# Patient Record
Sex: Female | Born: 1975 | Race: White | Hispanic: No | Marital: Married | Smoking: Former smoker
Health system: Southern US, Community
[De-identification: ages and names within clinical notes are randomized; demographics above are authoritative.]

## PROBLEM LIST (undated history)

## (undated) DIAGNOSIS — K279 Peptic ulcer, site unspecified, unspecified as acute or chronic, without hemorrhage or perforation: Secondary | ICD-10-CM

## (undated) DIAGNOSIS — K8020 Calculus of gallbladder without cholecystitis without obstruction: Secondary | ICD-10-CM

## (undated) DIAGNOSIS — B9681 Helicobacter pylori [H. pylori] as the cause of diseases classified elsewhere: Secondary | ICD-10-CM

## (undated) HISTORY — PX: LEEP: SHX91

---

## 2016-11-22 ENCOUNTER — Observation Stay (HOSPITAL_COMMUNITY)
Admission: EM | Admit: 2016-11-22 | Discharge: 2016-11-23 | Disposition: A | Discharge status: Home / Self Care | Payer: BLUE CROSS/BLUE SHIELD | Attending: General Surgery | Admitting: General Surgery

## 2016-11-22 ENCOUNTER — Encounter (HOSPITAL_COMMUNITY): Payer: Self-pay

## 2016-11-22 DIAGNOSIS — K810 Acute cholecystitis: Secondary | ICD-10-CM | POA: Diagnosis not present

## 2016-11-22 DIAGNOSIS — Z9889 Other specified postprocedural states: Secondary | ICD-10-CM | POA: Diagnosis not present

## 2016-11-22 DIAGNOSIS — K219 Gastro-esophageal reflux disease without esophagitis: Secondary | ICD-10-CM | POA: Diagnosis not present

## 2016-11-22 DIAGNOSIS — Z87891 Personal history of nicotine dependence: Secondary | ICD-10-CM | POA: Diagnosis not present

## 2016-11-22 DIAGNOSIS — Z8619 Personal history of other infectious and parasitic diseases: Secondary | ICD-10-CM | POA: Insufficient documentation

## 2016-11-22 DIAGNOSIS — K8050 Calculus of bile duct without cholangitis or cholecystitis without obstruction: Secondary | ICD-10-CM

## 2016-11-22 DIAGNOSIS — Z79899 Other long term (current) drug therapy: Secondary | ICD-10-CM | POA: Diagnosis not present

## 2016-11-22 DIAGNOSIS — K811 Chronic cholecystitis: Principal | ICD-10-CM | POA: Insufficient documentation

## 2016-11-22 DIAGNOSIS — R109 Unspecified abdominal pain: Secondary | ICD-10-CM | POA: Diagnosis present

## 2016-11-22 DIAGNOSIS — I10 Essential (primary) hypertension: Secondary | ICD-10-CM | POA: Diagnosis not present

## 2016-11-22 DIAGNOSIS — Z880 Allergy status to penicillin: Secondary | ICD-10-CM | POA: Insufficient documentation

## 2016-11-22 HISTORY — DX: Calculus of gallbladder without cholecystitis without obstruction: K80.20

## 2016-11-22 HISTORY — DX: Peptic ulcer, site unspecified, unspecified as acute or chronic, without hemorrhage or perforation: K27.9

## 2016-11-22 HISTORY — DX: Helicobacter pylori (H. pylori) as the cause of diseases classified elsewhere: B96.81

## 2016-11-22 LAB — URINALYSIS, ROUTINE W REFLEX MICROSCOPIC
BILIRUBIN URINE: NEGATIVE
Glucose, UA: NEGATIVE mg/dL
KETONES UR: 20 mg/dL — AB
NITRITE: NEGATIVE
PH: 5 (ref 5.0–8.0)
Protein, ur: NEGATIVE mg/dL
SPECIFIC GRAVITY, URINE: 1.018 (ref 1.005–1.030)

## 2016-11-22 LAB — COMPREHENSIVE METABOLIC PANEL
ALT: 117 U/L — ABNORMAL HIGH (ref 14–54)
AST: 69 U/L — AB (ref 15–41)
Albumin: 4.1 g/dL (ref 3.5–5.0)
Alkaline Phosphatase: 94 U/L (ref 38–126)
Anion gap: 12 (ref 5–15)
BILIRUBIN TOTAL: 0.5 mg/dL (ref 0.3–1.2)
BUN: 8 mg/dL (ref 6–20)
CO2: 25 mmol/L (ref 22–32)
CREATININE: 0.64 mg/dL (ref 0.44–1.00)
Calcium: 9.3 mg/dL (ref 8.9–10.3)
Chloride: 100 mmol/L — ABNORMAL LOW (ref 101–111)
Glucose, Bld: 96 mg/dL (ref 65–99)
POTASSIUM: 4 mmol/L (ref 3.5–5.1)
Sodium: 137 mmol/L (ref 135–145)
TOTAL PROTEIN: 7.7 g/dL (ref 6.5–8.1)

## 2016-11-22 LAB — CBC
HEMATOCRIT: 41 % (ref 36.0–46.0)
Hemoglobin: 13.6 g/dL (ref 12.0–15.0)
MCH: 30.9 pg (ref 26.0–34.0)
MCHC: 33.2 g/dL (ref 30.0–36.0)
MCV: 93.2 fL (ref 78.0–100.0)
PLATELETS: 271 10*3/uL (ref 150–400)
RBC: 4.4 MIL/uL (ref 3.87–5.11)
RDW: 13.1 % (ref 11.5–15.5)
WBC: 9 10*3/uL (ref 4.0–10.5)

## 2016-11-22 LAB — PREGNANCY, URINE: Preg Test, Ur: NEGATIVE

## 2016-11-22 LAB — LIPASE, BLOOD: LIPASE: 37 U/L (ref 11–51)

## 2016-11-22 MED ORDER — SODIUM CHLORIDE 0.9 % IV BOLUS (SEPSIS)
500.0000 mL | Freq: Once | INTRAVENOUS | Status: AC
  Administered 2016-11-22: 500 mL via INTRAVENOUS

## 2016-11-22 MED ORDER — MORPHINE SULFATE (PF) 2 MG/ML IV SOLN: 2.0000 mg | INTRAVENOUS | Status: DC | PRN

## 2016-11-22 MED ORDER — ACETAMINOPHEN 325 MG PO TABS: 650.0000 mg | ORAL_TABLET | Freq: Four times a day (QID) | ORAL | Status: DC | PRN

## 2016-11-22 MED ORDER — MORPHINE SULFATE (PF) 4 MG/ML IV SOLN
4.0000 mg | Freq: Once | INTRAVENOUS | Status: DC
  Filled 2016-11-22: qty 1

## 2016-11-22 MED ORDER — ONDANSETRON HCL 4 MG/2ML IJ SOLN
4.0000 mg | Freq: Four times a day (QID) | INTRAMUSCULAR | Status: DC | PRN
  Filled 2016-11-22: qty 2

## 2016-11-22 MED ORDER — LACTATED RINGERS IV SOLN
INTRAVENOUS | Status: DC
  Administered 2016-11-22 – 2016-11-23 (×2): via INTRAVENOUS

## 2016-11-22 MED ORDER — ONDANSETRON HCL 4 MG PO TABS: 4.0000 mg | ORAL_TABLET | Freq: Four times a day (QID) | ORAL | Status: DC | PRN

## 2016-11-22 MED ORDER — ACETAMINOPHEN 650 MG RE SUPP: 650.0000 mg | Freq: Four times a day (QID) | RECTAL | Status: DC | PRN

## 2016-11-22 MED ORDER — NORETHIN ACE-ETH ESTRAD-FE 1-20 MG-MCG PO TABS: 1.0000 | ORAL_TABLET | Freq: Every day | ORAL | Status: DC

## 2016-11-22 NOTE — ED Triage Notes (Addendum)
Pt reports that she has a gall stone and h pylori. Pt had ultrasound on Monday. Medications started on 11/19/16. Pt is complaining of upper abdominal that radiates into back. [place] and information was supposed to be sent to Dr Lovell SheehanJenkins

## 2016-11-22 NOTE — ED Provider Notes (Signed)
[facility]-EM[facility]Y DEPT Provider Note   CSN: [id number] Arrival date & time: 11/22/16  1624     History   Chief Complaint Chief Complaint  Patient presents with  . Abdominal Pain    HPI Lynn Rivera is a 41 y.o. female.  HPI Patient presents to the emergency room for evaluation of persistent abdominal pain. Patient has been having upper abdominal pain that radiates to her back. Patient was seen by her doctor and had an outpatient ultrasound performed. This test was done on Monday. Patient was called back and told that she had gallstones. She also tested for H. pylori. She is currently taking medications for that.  Patient is scheduled to see Dr. Lovell SheehanJenkins later this month. She continues to have unfortunately upper abdominal pain. She has not taken anything for pain.  She denies any nausea vomiting. No fevers.  Past Medical History:  Diagnosis Date  . Gall stones   . H pylori ulcer     There are no active problems to display for this patient.   Past Surgical History:  Procedure Laterality Date  . LEEP      OB History    No data available       Home Medications    Prior to Admission medications   Medication Sig Start Date End Date Taking? Authorizing Provider  Cholecalciferol 1000 units TBDP Take 1 tablet by mouth daily.   Yes [provider]  clarithromycin (BIAXIN) 500 MG tablet Take 1 tablet by mouth every 12 (twelve) hours. For 10 days. 11/19/16  Yes [provider]  DEXILANT 30 MG capsule Take 1 capsule by mouth daily. 10/02/16  Yes [provider]  JUNEL FE 1/20 1-20 MG-MCG tablet Take 1 tablet by mouth daily. 11/13/16  Yes [provider]  lisinopril (PRINIVIL,ZESTRIL) 10 MG tablet Take 1 tablet by mouth daily. 10/01/16  Yes [provider]  metroNIDAZOLE (FLAGYL) 500 MG tablet Take 1 tablet by mouth 2 (two) times daily. 7 days. 11/19/16  Yes [provider]  PRESCRIPTION MEDICATION Take 15 mLs by mouth every 2 (two)  hours. GI Cocktail 11/15/16  Yes [provider]    Family History No family history on file.  Social History Social History  Substance Use Topics  . Smoking status: Former Smoker    Quit date: 03/25/2016  . Smokeless tobacco: Never Used  . Alcohol use Yes     Comment: socially every other week     Allergies   Penicillins   Review of Systems Review of Systems  All other systems reviewed and are negative.    Physical Exam Updated Vital Signs BP 122/83   Pulse 89   Temp (!) 97.5 F (36.4 C) (Oral)   Resp 18   Ht 1.6 m (5\' 3" )   Wt 83.9 kg (185 lb)   LMP 11/22/2016   SpO2 99%   BMI 32.77 kg/m   Physical Exam  Constitutional: She appears well-developed and well-nourished. No distress.  HENT:  Head: Normocephalic and atraumatic.  Right Ear: External ear normal.  Left Ear: External ear normal.  Eyes: Conjunctivae are normal. Right eye exhibits no discharge. Left eye exhibits no discharge. No scleral icterus.  Neck: Neck supple. No tracheal deviation present.  Cardiovascular: Normal rate, regular rhythm and intact distal pulses.   Pulmonary/Chest: Effort normal and breath sounds normal. No stridor. No respiratory distress. She has no wheezes. She has no rales.  Abdominal: Soft. Bowel sounds are normal. She exhibits no distension. There is tenderness  in the right upper quadrant and epigastric area. There is no rebound and no guarding.  Musculoskeletal: She exhibits no edema or tenderness.  Neurological: She is alert. She has normal strength. No cranial nerve deficit (no facial droop, extraocular movements intact, no slurred speech) or sensory deficit. She exhibits normal muscle tone. She displays no seizure activity. Coordination normal.  Skin: Skin is warm and dry. No rash noted.  Psychiatric: She has a normal mood and affect.  Nursing note and vitals reviewed.    ED Treatments / Results  Labs (all labs ordered are listed, but only abnormal results are  displayed) Labs Reviewed  COMPREHENSIVE METABOLIC PANEL - Abnormal; Notable for the following:       Result Value   Chloride 100 (*)    AST 69 (*)    ALT 117 (*)    All other components within normal limits  LIPASE, BLOOD  CBC  URINALYSIS, ROUTINE W REFLEX MICROSCOPIC  PREGNANCY, URINE     Radiology No results found.  Procedures Procedures (including critical care time)  Medications Ordered in ED Medications  morphine 4 MG/ML injection 4 mg (4 mg Intravenous Not Given 11/22/16 1958)  sodium chloride 0.9 % bolus 500 mL (500 mLs Intravenous New Bag/Given 11/22/16 1932)     Initial Impression / Assessment and Plan / ED Course  I have reviewed the triage vital signs and the nursing notes.  Pertinent labs & imaging results that were available during my care of the patient were reviewed by me and considered in my medical decision making (see chart for details).  Clinical Course as of Nov 23 2107  Thu Nov 22, 2016  2107 Discussed with Dr Lovell Sheehan.  He reviewed her labs.    Would like to have the patient admitted to the hospital.  He would like to take her to surgery in the am.  NPO past midnight.  [JK]    Clinical Course User Index [JK] Linwood Dibbles, MD    Patient presented to the emergency room for evaluation of persistent biliary colic. Patient had an outpatient ultrasound that showed gallstones. She is scheduled to see Dr. Lovell Sheehan on the 17th. I spoke with Dr Lovell Sheehan.  He is going out of town next week.  Would like to have the patient admitted to the medical service. Nothing by mouth past midnight. He plans on having her gallbladder removed well. He would like to get the ultrasound results that she had as an outpatient.  Final Clinical Impressions(s) / ED Diagnoses   Final diagnoses:  Biliary colic      Linwood Dibbles, MD 11/22/16 2120

## 2016-11-22 NOTE — ED Notes (Signed)
Pt informed of need for urine sample. Pt states she does not have to go at this time but requests "a few more minutes" before attempting to provide sample.

## 2016-11-22 NOTE — H&P (Signed)
History and Physical    Roger Fasnacht WUJ:[id number] DOB: 05-12-1976 DOA: 11/22/2016  PCP: Devra Dopp, PA-C Consultants:  OB[facility] - [facility]; has an upcoming appointment with Dr. Lovell Sheehan Patient coming from: Home - lives with husband and son; NOK: husband, [phone]  Chief Complaint: Abdominal pain  HPI: Lynn Rivera is a 41 y.o. female with no significant past medical history presenting with cholecystitis.  Her symptoms started on Father's Day, when she did not feel well.  She was light-headed and flushed.  She has chronic GERD and she was thinking the reflux was acting up.  Last Thursday, she awoke with severe midepigastric pain and diffuse sweating.  Pain radiatied into her back and her stomach was burning.  She never vomited but she had mild intermittent nausea - particularly when she hadn't eaten anything in a while.  Belching, bloating, flatulence.  That morning, she called for a PCP appointment and she was seen at their express care.  She had UA, blood work, and a RUQ [place] Thursday AM at [facility].  Monday AM, the nurse called to let her know there is a gallstone with elevated LFTs,  H. pylori was positive and she was called in 2 antibiotics, started taking them Tuesday AM.  Pain worsened and she called the nurse back Tuesday.  She called Dr. Lovell Sheehan office and scheduled an appointment herself.  She continued to feel bad through yesterday AM.  It got a bit better after lunch.  Today, she went to work and ate breakfast and it started hurting again.  Constant band-like pressure around her upper abdomen.  Mid-epigastric burning.  No fever.     ED Course: Biliary colic - Dr. Lovell Sheehan requests admission and NPO for cholecystectomy  Review of Systems: As per HPI; otherwise review of systems reviewed and negative.   Ambulatory Status:  Ambulates without assistance  Past Medical History:  Diagnosis Date  . Gall stones   . H pylori ulcer     Past Surgical History:  Procedure  Laterality Date  . LEEP      Social History   Social History  . Marital status: Married    Spouse name: N/A  . Number of children: N/A  . Years of education: N/A   Occupational History  . Insurance agent    Social History Main Topics  . Smoking status: Former Smoker    Packs/day: 0.50    Years: 15.00    Quit date: 03/25/2016  . Smokeless tobacco: Never Used  . Alcohol use Yes     Comment: socially every other week 2-3 days  . Drug use: No  . Sexual activity: Not on file   Other Topics Concern  . Not on file   Social History Narrative  . No narrative on file    Allergies  Allergen Reactions  . Penicillins     Hives     History reviewed. No pertinent family history.  Prior to Admission medications   Medication Sig Start Date End Date Taking? Authorizing Provider  Cholecalciferol 1000 units TBDP Take 1 tablet by mouth daily.   Yes [provider]  clarithromycin (BIAXIN) 500 MG tablet Take 1 tablet by mouth every 12 (twelve) hours. For 10 days. 11/19/16  Yes [provider]  DEXILANT 30 MG capsule Take 1 capsule by mouth daily. 10/02/16  Yes [provider]  JUNEL FE 1/20 1-20 MG-MCG tablet Take 1 tablet by mouth daily. 11/13/16  Yes [provider]  lisinopril (PRINIVIL,ZESTRIL) 10 MG tablet  Take 1 tablet by mouth daily. 10/01/16  Yes [provider]  metroNIDAZOLE (FLAGYL) 500 MG tablet Take 1 tablet by mouth 2 (two) times daily. 7 days. 11/19/16  Yes [provider]  PRESCRIPTION MEDICATION Take 15 mLs by mouth every 2 (two) hours. GI Cocktail 11/15/16  Yes [provider]    Physical Exam: Vitals:   11/22/16 2000 11/22/16 2015 11/22/16 2030 11/22/16 2114  BP: 128/77  122/83 118/80  Pulse: 82 84 89 84  Resp:    17  Temp:      TempSrc:      SpO2: 100% 100% 99% 98%  Weight:      Height:         General:  Appears calm and comfortable and is NAD; she was holding her midepigastric region throughout our  encounter Eyes:  PERRL, EOMI, normal lids, iris ENT:  grossly normal hearing, lips & tongue, mmm Neck:  no LAD, masses or thyromegaly Cardiovascular:  RRR, no m/r/g. No LE edema.  Respiratory:  CTA bilaterally, no w/r/r. Normal respiratory effort. Abdomen:  soft, +midepigastric TTP > RUQ TTP, nd, hypoactive BS Skin:  no rash or induration seen on limited exam Musculoskeletal:  grossly normal tone BUE/BLE, good ROM, no bony abnormality Psychiatric:  grossly normal mood and affect, speech fluent and appropriate, AOx3 Neurologic:  CN 2-12 grossly intact, moves all extremities in coordinated fashion, sensation intact  Labs on Admission: I have personally reviewed following labs and imaging studies  CBC:  Recent Labs Lab 11/22/16 1826  WBC 9.0  HGB 13.6  HCT 41.0  MCV 93.2  PLT 271   Basic Metabolic Panel:  Recent Labs Lab 11/22/16 1826  NA 137  K 4.0  CL 100*  CO2 25  GLUCOSE 96  BUN 8  CREATININE 0.64  CALCIUM 9.3   GFR: Estimated Creatinine Clearance: 94 mL/min (by C-G formula based on SCr of 0.64 mg/dL). Liver Function Tests:  Recent Labs Lab 11/22/16 1826  AST 69*  ALT 117*  ALKPHOS 94  BILITOT 0.5  PROT 7.7  ALBUMIN 4.1    Recent Labs Lab 11/22/16 1826  LIPASE 37   No results for input(s): AMMONIA in the last 168 hours. Coagulation Profile: No results for input(s): INR, PROTIME in the last 168 hours. Cardiac Enzymes: No results for input(s): CKTOTAL, CKMB, CKMBINDEX, TROPONINI in the last 168 hours. BNP (last 3 results) No results for input(s): PROBNP in the last 8760 hours. HbA1C: No results for input(s): HGBA1C in the last 72 hours. CBG: No results for input(s): GLUCAP in the last 168 hours. Lipid Profile: No results for input(s): CHOL, HDL, LDLCALC, TRIG, CHOLHDL, LDLDIRECT in the last 72 hours. Thyroid Function Tests: No results for input(s): TSH, T4TOTAL, FREET4, T3FREE, THYROIDAB in the last 72 hours. Anemia Panel: No results for  input(s): VITAMINB12, FOLATE, FERRITIN, TIBC, IRON, RETICCTPCT in the last 72 hours. Urine analysis:    Component Value Date/Time   COLORURINE YELLOW 11/22/2016 2110   APPEARANCEUR HAZY (A) 11/22/2016 2110   LABSPEC 1.018 11/22/2016 2110   PHURINE 5.0 11/22/2016 2110   GLUCOSEU NEGATIVE 11/22/2016 2110   HGBUR MODERATE (A) 11/22/2016 2110   BILIRUBINUR NEGATIVE 11/22/2016 2110   KETONESUR 20 (A) 11/22/2016 2110   PROTEINUR NEGATIVE 11/22/2016 2110   NITRITE NEGATIVE 11/22/2016 2110   LEUKOCYTESUR TRACE (A) 11/22/2016 2110    Creatinine Clearance: Estimated Creatinine Clearance: 94 mL/min (by C-G formula based on SCr of 0.64 mg/dL).  Sepsis Labs: @LABRCNTIP (procalcitonin:4,lacticidven:4) )No results found for  this or any previous visit (from the past 240 hour(s)).   Radiological Exams on Admission: No results found.  EKG:  Not done  Assessment/Plan Principal Problem:   Acute cholecystitis   Pertinent labs: AST 69/ALT 117 UA: rare bacteria, moderate Hgb, 20 ketones, trace LE  -Patient with recent diagnosis of cholecystitis as well as +H pylori testing presenting with worsening RUQ/midepigastric pain -[facility] from [facility] is unavailable and this information will be needed in the AM; will reorder the RUQ [place] unless results can be obtained sooner -Clear liquids, NPO after midnight -Morphine for pain, Zofran for nausea -Dr. Lovell Sheehan to see in AM for probable cholecystectomy -Empiric coverage with Zosyn for now -May need intraoperative cholangiogram to ensure no additional stones are contributing to elevated LFTs    DVT prophylaxis:  SCDs Code Status: Full - confirmed with patient/family.  However, she is adamant that she would only want very short-term life support and only in this particular circumstance (clear problem with an easy solution and very low likelihood of a bad outcome) Family Communication: Husband present throughout evaluation  Disposition  Plan:  Home once clinically improved Consults called: Surgery  Admission status: Admit - It is my clinical opinion that admission to INPATIENT is reasonable and necessary because this patient will require at least 2 midnights in the hospital to treat this condition based on the medical complexity of the problems presented.  Given the aforementioned information, the predictability of an adverse outcome is felt to be significant.    Jonah Blue MD [facility]  If 7PM-7AM, please contact night-coverage [email or web address] Password [email or web address]  11/22/2016, 11:01 PM

## 2016-11-23 ENCOUNTER — Inpatient Hospital Stay (HOSPITAL_COMMUNITY): Payer: BLUE CROSS/BLUE SHIELD

## 2016-11-23 ENCOUNTER — Encounter (HOSPITAL_COMMUNITY): Payer: Self-pay | Admitting: *Deleted

## 2016-11-23 ENCOUNTER — Encounter (HOSPITAL_COMMUNITY)
Admission: EM | Disposition: A | Discharge status: Home / Self Care | Payer: Self-pay | Source: Home / Self Care | Attending: Emergency Medicine

## 2016-11-23 ENCOUNTER — Inpatient Hospital Stay (HOSPITAL_COMMUNITY): Payer: BLUE CROSS/BLUE SHIELD | Admitting: Anesthesiology

## 2016-11-23 DIAGNOSIS — K810 Acute cholecystitis: Secondary | ICD-10-CM

## 2016-11-23 HISTORY — PX: CHOLECYSTECTOMY: SHX55

## 2016-11-23 LAB — CBC
HEMATOCRIT: 36.7 % (ref 36.0–46.0)
HEMOGLOBIN: 12.1 g/dL (ref 12.0–15.0)
MCH: 30.9 pg (ref 26.0–34.0)
MCHC: 33 g/dL (ref 30.0–36.0)
MCV: 93.6 fL (ref 78.0–100.0)
Platelets: 257 10*3/uL (ref 150–400)
RBC: 3.92 MIL/uL (ref 3.87–5.11)
RDW: 13.4 % (ref 11.5–15.5)
WBC: 6.5 10*3/uL (ref 4.0–10.5)

## 2016-11-23 LAB — COMPREHENSIVE METABOLIC PANEL
ALBUMIN: 3.2 g/dL — AB (ref 3.5–5.0)
ALT: 91 U/L — ABNORMAL HIGH (ref 14–54)
AST: 52 U/L — AB (ref 15–41)
Alkaline Phosphatase: 77 U/L (ref 38–126)
Anion gap: 7 (ref 5–15)
BILIRUBIN TOTAL: 0.5 mg/dL (ref 0.3–1.2)
BUN: 7 mg/dL (ref 6–20)
CO2: 27 mmol/L (ref 22–32)
Calcium: 8.6 mg/dL — ABNORMAL LOW (ref 8.9–10.3)
Chloride: 104 mmol/L (ref 101–111)
Creatinine, Ser: 0.69 mg/dL (ref 0.44–1.00)
GFR calc Af Amer: >60 mL/min (ref >60)
GFR calc non Af Amer: >60 mL/min (ref >60)
GLUCOSE: 92 mg/dL (ref 65–99)
POTASSIUM: 4 mmol/L (ref 3.5–5.1)
SODIUM: 138 mmol/L (ref 135–145)
TOTAL PROTEIN: 6.3 g/dL — AB (ref 6.5–8.1)

## 2016-11-23 LAB — SURGICAL PCR SCREEN
MRSA, PCR: NEGATIVE
STAPHYLOCOCCUS AUREUS: NEGATIVE

## 2016-11-23 SURGERY — LAPAROSCOPIC CHOLECYSTECTOMY
Anesthesia: General | Site: Abdomen

## 2016-11-23 MED ORDER — MIDAZOLAM HCL 2 MG/2ML IJ SOLN
INTRAMUSCULAR | Status: AC
  Filled 2016-11-23: qty 2

## 2016-11-23 MED ORDER — PHENYLEPHRINE 40 MCG/ML (10ML) SYRINGE FOR IV PUSH (FOR BLOOD PRESSURE SUPPORT)
PREFILLED_SYRINGE | INTRAVENOUS | Status: AC
  Filled 2016-11-23: qty 10

## 2016-11-23 MED ORDER — SODIUM CHLORIDE 0.9 % IV SOLN
1.0000 g | Freq: Once | INTRAVENOUS | Status: AC
  Administered 2016-11-23: 1 g via INTRAVENOUS
  Filled 2016-11-23 (×2): qty 1

## 2016-11-23 MED ORDER — CHLORHEXIDINE GLUCONATE CLOTH 2 % EX PADS
6.0000 | MEDICATED_PAD | Freq: Once | CUTANEOUS | Status: AC
  Administered 2016-11-23: 6 via TOPICAL

## 2016-11-23 MED ORDER — ROCURONIUM BROMIDE 100 MG/10ML IV SOLN
INTRAVENOUS | Status: DC | PRN
  Administered 2016-11-23: 20 mg via INTRAVENOUS
  Administered 2016-11-23: 5 mg via INTRAVENOUS

## 2016-11-23 MED ORDER — ROCURONIUM BROMIDE 50 MG/5ML IV SOLN
INTRAVENOUS | Status: AC
  Filled 2016-11-23: qty 1

## 2016-11-23 MED ORDER — SODIUM CHLORIDE 0.9 % IR SOLN
Status: DC | PRN
Start: 2016-11-23 — End: 2016-11-23
  Administered 2016-11-23: 1000 mL

## 2016-11-23 MED ORDER — GLYCOPYRROLATE 0.2 MG/ML IJ SOLN
INTRAMUSCULAR | Status: DC | PRN
  Administered 2016-11-23: 0.1 mg via INTRAVENOUS

## 2016-11-23 MED ORDER — FENTANYL CITRATE (PF) 100 MCG/2ML IJ SOLN
INTRAMUSCULAR | Status: DC | PRN
  Administered 2016-11-23 (×4): 50 ug via INTRAVENOUS
  Administered 2016-11-23: 100 ug via INTRAVENOUS
  Administered 2016-11-23: 50 ug via INTRAVENOUS

## 2016-11-23 MED ORDER — SUGAMMADEX SODIUM 200 MG/2ML IV SOLN
INTRAVENOUS | Status: DC | PRN
  Administered 2016-11-23: 170 mg via INTRAVENOUS

## 2016-11-23 MED ORDER — SUCCINYLCHOLINE CHLORIDE 20 MG/ML IJ SOLN
INTRAMUSCULAR | Status: AC
  Filled 2016-11-23: qty 1

## 2016-11-23 MED ORDER — BUPIVACAINE HCL (PF) 0.5 % IJ SOLN
INTRAMUSCULAR | Status: AC
  Filled 2016-11-23: qty 30

## 2016-11-23 MED ORDER — POVIDONE-IODINE 10 % EX OINT
TOPICAL_OINTMENT | CUTANEOUS | Status: AC
  Filled 2016-11-23: qty 1

## 2016-11-23 MED ORDER — FENTANYL CITRATE (PF) 100 MCG/2ML IJ SOLN
25.0000 ug | INTRAMUSCULAR | Status: DC | PRN
  Administered 2016-11-23: 50 ug via INTRAVENOUS
  Filled 2016-11-23: qty 2

## 2016-11-23 MED ORDER — DEXAMETHASONE SODIUM PHOSPHATE 4 MG/ML IJ SOLN
INTRAMUSCULAR | Status: DC | PRN
  Administered 2016-11-23: 4 mg via INTRAVENOUS

## 2016-11-23 MED ORDER — BUPIVACAINE HCL (PF) 0.5 % IJ SOLN
INTRAMUSCULAR | Status: DC | PRN
  Administered 2016-11-23: 10 mL

## 2016-11-23 MED ORDER — DEXAMETHASONE SODIUM PHOSPHATE 4 MG/ML IJ SOLN
INTRAMUSCULAR | Status: AC
  Filled 2016-11-23: qty 1

## 2016-11-23 MED ORDER — ONDANSETRON HCL 4 MG/2ML IJ SOLN
4.0000 mg | Freq: Once | INTRAMUSCULAR | Status: AC
  Administered 2016-11-23: 4 mg via INTRAVENOUS

## 2016-11-23 MED ORDER — HYDROCODONE-ACETAMINOPHEN 5-325 MG PO TABS
1.0000 | ORAL_TABLET | ORAL | Status: DC | PRN
  Administered 2016-11-23: 2 via ORAL
  Filled 2016-11-23: qty 2

## 2016-11-23 MED ORDER — GLYCOPYRROLATE 0.2 MG/ML IJ SOLN
INTRAMUSCULAR | Status: AC
  Filled 2016-11-23: qty 1

## 2016-11-23 MED ORDER — POVIDONE-IODINE 10 % OINT PACKET
TOPICAL_OINTMENT | CUTANEOUS | Status: DC | PRN
  Administered 2016-11-23: 1 via TOPICAL

## 2016-11-23 MED ORDER — PROPOFOL 10 MG/ML IV BOLUS
INTRAVENOUS | Status: DC | PRN
  Administered 2016-11-23: 170 mg via INTRAVENOUS

## 2016-11-23 MED ORDER — LACTATED RINGERS IV SOLN
INTRAVENOUS | Status: DC
  Administered 2016-11-23: 09:00:00 via INTRAVENOUS

## 2016-11-23 MED ORDER — METOCLOPRAMIDE HCL 5 MG/ML IJ SOLN
INTRAMUSCULAR | Status: DC | PRN
  Administered 2016-11-23: 10 mg via INTRAVENOUS

## 2016-11-23 MED ORDER — PHENYLEPHRINE HCL 10 MG/ML IJ SOLN
INTRAMUSCULAR | Status: DC | PRN
  Administered 2016-11-23: 80 ug via INTRAVENOUS

## 2016-11-23 MED ORDER — PROPOFOL 10 MG/ML IV BOLUS
INTRAVENOUS | Status: AC
  Filled 2016-11-23: qty 20

## 2016-11-23 MED ORDER — HEMOSTATIC AGENTS (NO CHARGE) OPTIME
TOPICAL | Status: DC | PRN
  Administered 2016-11-23: 1 via TOPICAL

## 2016-11-23 MED ORDER — METOCLOPRAMIDE HCL 5 MG/ML IJ SOLN
INTRAMUSCULAR | Status: AC
  Filled 2016-11-23: qty 2

## 2016-11-23 MED ORDER — LIDOCAINE HCL (PF) 1 % IJ SOLN
INTRAMUSCULAR | Status: DC | PRN
  Administered 2016-11-23: 40 mg

## 2016-11-23 MED ORDER — SUGAMMADEX SODIUM 500 MG/5ML IV SOLN
INTRAVENOUS | Status: AC
  Filled 2016-11-23: qty 5

## 2016-11-23 MED ORDER — CHLORHEXIDINE GLUCONATE CLOTH 2 % EX PADS: 6.0000 | MEDICATED_PAD | Freq: Once | CUTANEOUS | Status: DC

## 2016-11-23 MED ORDER — SUCCINYLCHOLINE CHLORIDE 20 MG/ML IJ SOLN
INTRAMUSCULAR | Status: DC | PRN
  Administered 2016-11-23: 100 mg via INTRAVENOUS

## 2016-11-23 MED ORDER — FENTANYL CITRATE (PF) 250 MCG/5ML IJ SOLN
INTRAMUSCULAR | Status: AC
  Filled 2016-11-23: qty 5

## 2016-11-23 MED ORDER — MIDAZOLAM HCL 2 MG/2ML IJ SOLN
1.0000 mg | INTRAMUSCULAR | Status: DC
  Administered 2016-11-23: 2 mg via INTRAVENOUS

## 2016-11-23 MED ORDER — LIDOCAINE HCL (PF) 1 % IJ SOLN
INTRAMUSCULAR | Status: AC
  Filled 2016-11-23: qty 5

## 2016-11-23 MED ORDER — KETOROLAC TROMETHAMINE 30 MG/ML IJ SOLN
30.0000 mg | Freq: Once | INTRAMUSCULAR | Status: AC
  Administered 2016-11-23: 30 mg via INTRAVENOUS
  Filled 2016-11-23: qty 1

## 2016-11-23 MED ORDER — FENTANYL CITRATE (PF) 100 MCG/2ML IJ SOLN
INTRAMUSCULAR | Status: AC
  Filled 2016-11-23: qty 2

## 2016-11-23 MED ORDER — HYDROCODONE-ACETAMINOPHEN 5-325 MG PO TABS: 1.0000 | ORAL_TABLET | ORAL | 0 refills | Status: AC | PRN

## 2016-11-23 SURGICAL SUPPLY — 44 items
APPLIER CLIP LAPSCP 10X32 DD (CLIP) ×3 IMPLANT
BAG HAMPER (MISCELLANEOUS) ×3 IMPLANT
BAG RETRIEVAL 10 (BASKET) ×1
BAG RETRIEVAL 10MM (BASKET) ×1
CHLORAPREP W/TINT 26ML (MISCELLANEOUS) ×3 IMPLANT
CLOTH BEACON ORANGE TIMEOUT ST (SAFETY) ×3 IMPLANT
COVER LIGHT HANDLE STERIS (MISCELLANEOUS) ×6 IMPLANT
DECANTER SPIKE VIAL GLASS SM (MISCELLANEOUS) ×3 IMPLANT
ELECT REM PT RETURN 9FT ADLT (ELECTROSURGICAL) ×3
ELECTRODE REM PT RTRN 9FT ADLT (ELECTROSURGICAL) ×1 IMPLANT
FILTER SMOKE EVAC LAPAROSHD (FILTER) ×3 IMPLANT
FORMALIN 10 PREFIL 120ML (MISCELLANEOUS) ×3 IMPLANT
GLOVE BIOGEL PI IND STRL 7.0 (GLOVE) ×2 IMPLANT
GLOVE BIOGEL PI INDICATOR 7.0 (GLOVE) ×4
GLOVE SURG SS PI 7.5 STRL IVOR (GLOVE) ×3 IMPLANT
GOWN STRL REUS W/ TWL XL LVL3 (GOWN DISPOSABLE) ×1 IMPLANT
GOWN STRL REUS W/TWL LRG LVL3 (GOWN DISPOSABLE) ×6 IMPLANT
GOWN STRL REUS W/TWL XL LVL3 (GOWN DISPOSABLE) ×2
HEMOSTAT SNOW SURGICEL 2X4 (HEMOSTASIS) ×3 IMPLANT
INST SET LAPROSCOPIC AP (KITS) ×3 IMPLANT
IV NS IRRIG 3000ML ARTHROMATIC (IV SOLUTION) IMPLANT
KIT ROOM TURNOVER APOR (KITS) ×3 IMPLANT
MANIFOLD NEPTUNE II (INSTRUMENTS) ×3 IMPLANT
NEEDLE INSUFFLATION 14GA 120MM (NEEDLE) ×3 IMPLANT
NS IRRIG 1000ML POUR BTL (IV SOLUTION) ×3 IMPLANT
PACK LAP CHOLE LZT030E (CUSTOM PROCEDURE TRAY) ×3 IMPLANT
PAD ARMBOARD 7.5X6 YLW CONV (MISCELLANEOUS) ×3 IMPLANT
SET BASIN LINEN APH (SET/KITS/TRAYS/PACK) ×3 IMPLANT
SET TUBE IRRIG SUCTION NO TIP (IRRIGATION / IRRIGATOR) IMPLANT
SLEEVE ENDOPATH XCEL 5M (ENDOMECHANICALS) ×3 IMPLANT
SPONGE GAUZE 2X2 8PLY STER LF (GAUZE/BANDAGES/DRESSINGS) ×1
SPONGE GAUZE 2X2 8PLY STRL LF (GAUZE/BANDAGES/DRESSINGS) ×2 IMPLANT
STAPLER VISISTAT (STAPLE) ×3 IMPLANT
SUT VICRYL 0 UR6 27IN ABS (SUTURE) ×3 IMPLANT
SYS BAG RETRIEVAL 10MM (BASKET) ×1
SYSTEM BAG RETRIEVAL 10MM (BASKET) ×1 IMPLANT
TAPE PAPER 2X10 WHT MICROPORE (GAUZE/BANDAGES/DRESSINGS) ×3 IMPLANT
TROCAR ENDO BLADELESS 11MM (ENDOMECHANICALS) ×3 IMPLANT
TROCAR XCEL NON-BLD 5MMX100MML (ENDOMECHANICALS) ×3 IMPLANT
TROCAR XCEL UNIV SLVE 11M 100M (ENDOMECHANICALS) ×3 IMPLANT
TUBE CONNECTING 12'X1/4 (SUCTIONS) ×1
TUBE CONNECTING 12X1/4 (SUCTIONS) ×2 IMPLANT
TUBING INSUFFLATION (TUBING) ×3 IMPLANT
WARMER LAPAROSCOPE (MISCELLANEOUS) ×3 IMPLANT

## 2016-11-23 NOTE — Op Note (Signed)
Patient:  Lynn Rivera  DOB:  11/15/1975  MRN:  [id number]   Preop Diagnosis:  Cholecystitis, cholelithiasis  Postop Diagnosis:  Same  Procedure:  Laparoscopic cholecystectomy  Surgeon:  Franky MachoMark Suzie Vandam, M.D.  Anes:  Gen. endotracheal  Indications:  Patient is a 41 year old white female who presents with right upper quadrant abdominal pain and abnormal liver enzyme tests. Ultrasound gallbladder reveals sludge with probable cholelithiasis. The risks and benefits of the procedure including bleeding, infection, hepatobiliary injury, and the possibility of an open procedure were fully explained to the patient, who gave informed consent.  Procedure note:  The patient was placed the supine position. After induction of general endotracheal anesthesia, the abdomen was prepped and draped using the usual sterile technique with DuraPrep. Surgical site confirmation was performed.  A supraumbilical incision was made down to the fascia. A Veress needle was introduced into the abdominal cavity and confirmation of placement was done using the saline drop test. The abdomen was then insufflated to 16 mmHg pressure. An 11 mm trocar was introduced into the abdominal cavity under direct visualization without difficulty. The patient was placed in reverse Trendelenburg position and an additional 11 mm trocar was placed the epigastric region and 5 mm trochars were placed the right upper quadrant and right flank regions. Liver was inspected and noted to be within normal limits. The gallbladder was retracted in a dynamic fashion in order to provide a critical view of the triangle of Calot. The cystic duct was first identified. Its juncture to the infundibulum was fully identified. Endoclips were placed proximally and distally on the cystic duct, and the cystic duct was divided. This was likewise done to the cystic artery. The gallbladder was freed away from the gallbladder fossa using Bovie electrocautery. The gallbladder was  delivered through the epigastric trocar site using an Endo Catch bag. The gallbladder fossa was inspected and no abnormal bleeding or bile leakage was noted. Surgicel was placed the gallbladder fossa. All fluid and air were then evacuated from the abdominal cavity prior to the removal of the trochars.  All wounds were irrigated with normal saline. All wounds were injected with 0.5% Sensorcaine. The supraumbilical fascia was reapproximated using an 0 Vicryl interrupted suture. All skin incisions were closed using staples. Betadine ointment and dry sterile dressings were applied.  All tape and needle counts were correct at the end the procedure. The patient was extubated in the operating room and transferred to PACU in stable condition.  Complications:  None  EBL:  Minimal  Specimen:  Gallbladder

## 2016-11-23 NOTE — Discharge Instructions (Signed)
Laparoscopic Cholecystectomy, Care After °This sheet gives you information about how to care for yourself after your procedure. Your health care provider may also give you more specific instructions. If you have problems or questions, contact your health care provider. °What can I expect after the procedure? °After the procedure, it is common to have: °· Pain at your incision sites. You will be given medicines to control this pain. °· Mild nausea or vomiting. °· Bloating and possible shoulder pain from the air-like gas that was used during the procedure. °Follow these instructions at home: °Incision care  ° °· Follow instructions from your health care provider about how to take care of your incisions. Make sure you: °¨ Wash your hands with soap and water before you change your bandage (dressing). If soap and water are not available, use hand sanitizer. °¨ Change your dressing as told by your health care provider. °¨ Leave stitches (sutures), skin glue, or adhesive strips in place. These skin closures may need to be in place for 2 weeks or longer. If adhesive strip edges start to loosen and curl up, you may trim the loose edges. Do not remove adhesive strips completely unless your health care provider tells you to do that. °· Do not take baths, swim, or use a hot tub until your health care provider approves. Ask your health care provider if you can take showers. You may only be allowed to take sponge baths for bathing. °· Check your incision area every day for signs of infection. Check for: °¨ More redness, swelling, or pain. °¨ More fluid or blood. °¨ Warmth. °¨ Pus or a bad smell. °Activity  °· Do not drive or use heavy machinery while taking prescription pain medicine. °· Do not lift anything that is heavier than 10 lb (4.5 kg) until your health care provider approves. °· Do not play contact sports until your health care provider approves. °· Do not drive for 24 hours if you were given a medicine to help you relax  (sedative). °· Rest as needed. Do not return to work or school until your health care provider approves. °General instructions  °· Take over-the-counter and prescription medicines only as told by your health care provider. °· To prevent or treat constipation while you are taking prescription pain medicine, your health care provider may recommend that you: °¨ Drink enough fluid to keep your urine clear or pale yellow. °¨ Take over-the-counter or prescription medicines. °¨ Eat foods that are high in fiber, such as fresh fruits and vegetables, whole grains, and beans. °¨ Limit foods that are high in fat and processed sugars, such as fried and sweet foods. °Contact a health care provider if: °· You develop a rash. °· You have more redness, swelling, or pain around your incisions. °· You have more fluid or blood coming from your incisions. °· Your incisions feel warm to the touch. °· You have pus or a bad smell coming from your incisions. °· You have a fever. °· One or more of your incisions breaks open. °Get help right away if: °· You have trouble breathing. °· You have chest pain. °· You have increasing pain in your shoulders. °· You faint or feel dizzy when you stand. °· You have severe pain in your abdomen. °· You have nausea or vomiting that lasts for more than one day. °· You have leg pain. °This information is not intended to replace advice given to you by your health care provider. Make sure you discuss any   questions you have with your health care provider. °Document Released: 05/07/2005 Document Revised: 11/26/2015 Document Reviewed: 10/24/2015 °Elsevier Interactive Patient Education © 2017 [employer] ° °

## 2016-11-23 NOTE — Transfer of Care (Signed)
Immediate Anesthesia Transfer of Care Note  Patient: Lynn Rivera  Procedure(s) Performed: Procedure(s): LAPAROSCOPIC CHOLECYSTECTOMY (N/A)  Patient Location: PACU  Anesthesia Type:General  Level of Consciousness: awake, alert , oriented and patient cooperative  Airway & Oxygen Therapy: Patient Spontanous Breathing  Post-op Assessment: Report given to RN and Post -op Vital signs reviewed and stable  Post vital signs: Reviewed and stable 93 NSR, 96% spo2 on RA, 98.3, 119/68  Last Vitals:  Vitals:   11/23/16 0900 11/23/16 0905  BP: 131/90 137/82  Pulse:    Resp: 17 16  Temp:      Last Pain:  Vitals:   11/23/16 0841  TempSrc: Oral  PainSc: 3       Patients Stated Pain Goal: 6 (11/23/16 0841)  Complications: No apparent anesthesia complications

## 2016-11-23 NOTE — Anesthesia Preprocedure Evaluation (Signed)
Anesthesia Evaluation  Patient identified by MRN, date of birth, ID band Patient awake    Reviewed: Allergy & Precautions, NPO status , Patient's Chart, lab work & pertinent test results  Airway Mallampati: I  TM Distance: >3 FB Neck ROM: Full    Dental  (+) Teeth Intact   Pulmonary former smoker,    breath sounds clear to auscultation       Cardiovascular hypertension, Pt. on medications  Rhythm:Regular Rate:Normal     Neuro/Psych negative neurological ROS  negative psych ROS   GI/Hepatic PUD, GERD  Medicated and Controlled,  Endo/Other  negative endocrine ROS  Renal/GU negative Renal ROS     Musculoskeletal   Abdominal   Peds  Hematology   Anesthesia Other Findings acute cholecystitis  Reproductive/Obstetrics                             Anesthesia Physical Anesthesia Plan  ASA: II and emergent  Anesthesia Plan: General   Post-op Pain Management:    Induction: Intravenous, Rapid sequence and Cricoid pressure planned  PONV Risk Score and Plan:   Airway Management Planned: Oral ETT  Additional Equipment:   Intra-op Plan:   Post-operative Plan: Extubation in OR  Informed Consent: I have reviewed the patients History and Physical, chart, labs and discussed the procedure including the risks, benefits and alternatives for the proposed anesthesia with the patient or authorized representative who has indicated his/her understanding and acceptance.     Plan Discussed with:   Anesthesia Plan Comments:         Anesthesia Quick Evaluation

## 2016-11-23 NOTE — Progress Notes (Signed)
Patient being d/c home with instructions. IV cath removed and intact. No c/o pain at this time or at site. Prescriptions for pain given to patient. Patient ambulated to restroom and tolerated well.

## 2016-11-23 NOTE — Anesthesia Postprocedure Evaluation (Signed)
Anesthesia Post Note  Patient: Doristine ChurchLea Dumais  Procedure(s) Performed: Procedure(s) (LRB): LAPAROSCOPIC CHOLECYSTECTOMY (N/A)  Patient location during evaluation: PACU Anesthesia Type: General Level of consciousness: awake, awake and alert, oriented and patient cooperative Pain management: pain level controlled Vital Signs Assessment: post-procedure vital signs reviewed and stable Respiratory status: spontaneous breathing, nonlabored ventilation and respiratory function stable Cardiovascular status: stable Postop Assessment: no signs of nausea or vomiting Anesthetic complications: no     Last Vitals:  Vitals:   11/23/16 0900 11/23/16 0905  BP: 131/90 137/82  Pulse:    Resp: 17 16  Temp:      Last Pain:  Vitals:   11/23/16 0841  TempSrc: Oral  PainSc: 3                  Elsworth Ledin L

## 2016-11-23 NOTE — Consult Note (Signed)
Reason for Consult: Right upper quadrant abdominal pain Referring Physician: Dr. Allayne Stack is an 41 y.o. female.  HPI: Patient is a 41 year old white female with a known history of cholelithiasis who presents with worsening right upper quadrant abdominal pain. She denies any fever, chills, jaundice. She was supposed to see me in my office but her pain and nausea got worse. She presented the emergency room for further evaluation treatment. In the emergency room, she was noted to have slightly elevated liver enzyme tests. Total bilirubin was within normal limits. She was brought into the hospital for further evaluation and treatment.  Past Medical History:  Diagnosis Date  . Gall stones   . H pylori ulcer     Past Surgical History:  Procedure Laterality Date  . LEEP      History reviewed. No pertinent family history.  Social History:  reports that she quit smoking about 7 months ago. She has a 7.50 pack-year smoking history. She has never used smokeless tobacco. She reports that she drinks alcohol. She reports that she does not use drugs.  Allergies:  Allergies  Allergen Reactions  . Penicillins     Hives     Medications:  Scheduled: .  morphine injection  4 mg Intravenous Once  . norethindrone-ethinyl estradiol  1 tablet Oral Daily    Results for orders placed or performed during the hospital encounter of 11/22/16 (from the past 48 hour(s))  Lipase, blood     Status: None   Collection Time: 11/22/16  6:26 PM  Result Value Ref Range   Lipase 37 11 - 51 U/L  Comprehensive metabolic panel     Status: Abnormal   Collection Time: 11/22/16  6:26 PM  Result Value Ref Range   Sodium 137 135 - 145 mmol/L   Potassium 4.0 3.5 - 5.1 mmol/L   Chloride 100 (L) 101 - 111 mmol/L   CO2 25 22 - 32 mmol/L   Glucose, Bld 96 65 - 99 mg/dL   BUN 8 6 - 20 mg/dL   Creatinine, Ser 0.64 0.44 - 1.00 mg/dL   Calcium 9.3 8.9 - 10.3 mg/dL   Total Protein 7.7 6.5 - 8.1 g/dL   Albumin 4.1  3.5 - 5.0 g/dL   AST 69 (H) 15 - 41 U/L   ALT 117 (H) 14 - 54 U/L   Alkaline Phosphatase 94 38 - 126 U/L   Total Bilirubin 0.5 0.3 - 1.2 mg/dL   GFR calc non Af Amer >60 >60 mL/min   GFR calc Af Amer >60 >60 mL/min    Comment: (NOTE) The eGFR has been calculated using the CKD EPI equation. This calculation has not been validated in all clinical situations. eGFR's persistently <60 mL/min signify possible Chronic Kidney Disease.    Anion gap 12 5 - 15  CBC     Status: None   Collection Time: 11/22/16  6:26 PM  Result Value Ref Range   WBC 9.0 4.0 - 10.5 K/uL   RBC 4.40 3.87 - 5.11 MIL/uL   Hemoglobin 13.6 12.0 - 15.0 g/dL   HCT 41.0 36.0 - 46.0 %   MCV 93.2 78.0 - 100.0 fL   MCH 30.9 26.0 - 34.0 pg   MCHC 33.2 30.0 - 36.0 g/dL   RDW 13.1 11.5 - 15.5 %   Platelets 271 150 - 400 K/uL  Urinalysis, Routine w reflex microscopic     Status: Abnormal   Collection Time: 11/22/16  9:10 PM  Result Value Ref  Range   Color, Urine YELLOW YELLOW   APPearance HAZY (A) CLEAR   Specific Gravity, Urine 1.018 1.005 - 1.030   pH 5.0 5.0 - 8.0   Glucose, UA NEGATIVE NEGATIVE mg/dL   Hgb urine dipstick MODERATE (A) NEGATIVE   Bilirubin Urine NEGATIVE NEGATIVE   Ketones, ur 20 (A) NEGATIVE mg/dL   Protein, ur NEGATIVE NEGATIVE mg/dL   Nitrite NEGATIVE NEGATIVE   Leukocytes, UA TRACE (A) NEGATIVE   RBC / HPF 0-5 0 - 5 RBC/hpf   WBC, UA 0-5 0 - 5 WBC/hpf   Bacteria, UA RARE (A) NONE SEEN   Squamous Epithelial / LPF 6-30 (A) NONE SEEN   Mucous PRESENT   Pregnancy, urine     Status: None   Collection Time: 11/22/16  9:10 PM  Result Value Ref Range   Preg Test, Ur NEGATIVE NEGATIVE    Comment:        THE SENSITIVITY OF THIS METHODOLOGY IS >20 mIU/mL.     No results found.  ROS:  Pertinent items are noted in HPI.  Blood pressure 122/84, pulse 68, temperature 98 F (36.7 C), temperature source Oral, resp. rate 18, height 5' 3.5" (1.613 m), weight 187 lb 8 oz (85 kg), last menstrual  period 11/22/2016, SpO2 98 %. Physical Exam: Well-developed well-nourished white female in no acute distress Head is normocephalic, atraumatic Eyes are without scleral icterus Lungs clear auscultation with equal breath sounds bilaterally Heart examination reveals a regular rate and rhythm without S3, S4, murmurs Abdomen is soft with slight tenderness to deep palpation in the right upper quadrant. No hepatosplenomegaly, masses, hernias identified.  Assessment/Plan: Impression: Right upper quadrant abdominal pain most likely secondary to cholelithiasis. Unfortunately, have not been able to get the ultrasound reports from [place]. Plan: Right upper quadrant ultrasound to be performed shortly. If positive for cholelithiasis, we will proceed with a laparoscopic cholecystectomy. The risks and benefits of the procedure including bleeding, infection, hepatobiliary injury, and the possibility of a open procedure were fully explained to the patient, who gave informed consent.  Aviva Signs 11/23/2016, 7:26 AM

## 2016-11-23 NOTE — Anesthesia Procedure Notes (Signed)
Procedure Name: Intubation Date/Time: 11/23/2016 9:48 AM Performed by: Raenette Rover Pre-anesthesia Checklist: Patient identified, Emergency Drugs available, Suction available and Patient being monitored Patient Re-evaluated:Patient Re-evaluated prior to inductionOxygen Delivery Method: Circle system utilized Preoxygenation: Pre-oxygenation with 100% oxygen Intubation Type: IV induction, Cricoid Pressure applied and Rapid sequence Laryngoscope Size: Mac and 3 Grade View: Grade I Tube type: Oral Tube size: 7.0 mm Number of attempts: 1 Airway Equipment and Method: Stylet Placement Confirmation: ETT inserted through vocal cords under direct vision,  positive ETCO2,  CO2 detector and breath sounds checked- equal and bilateral Secured at: 21 cm Tube secured with: Tape Dental Injury: Teeth and Oropharynx as per pre-operative assessment

## 2016-11-23 NOTE — Progress Notes (Signed)
ANTIBIOTIC CONSULT NOTE-Preliminary  Pharmacy Consult for zosyn- changed to invanz Indication: intra-abdominal infection  Allergies  Allergen Reactions  . Penicillins     Hives     Patient Measurements: Height: 5' 3.5" (161.3 cm) Weight: 187 lb 8 oz (85 kg) IBW/kg (Calculated) : 53.55 Adjusted Body Weight:   Vital Signs: Temp: 97.5 F (36.4 C) (07/05 2323) Temp Source: Oral (07/05 2323) BP: 135/74 (07/05 2323) Pulse Rate: 77 (07/05 2323)  Labs:  Recent Labs  11/22/16 1826  WBC 9.0  HGB 13.6  PLT 271  CREATININE 0.64    Estimated Creatinine Clearance: 95.7 mL/min (by C-G formula based on SCr of 0.64 mg/dL).  No results for input(s): VANCOTROUGH, VANCOPEAK, VANCORANDOM, GENTTROUGH, GENTPEAK, GENTRANDOM, TOBRATROUGH, TOBRAPEAK, TOBRARND, AMIKACINPEAK, AMIKACINTROU, AMIKACIN in the last 72 hours.   Microbiology: No results found for this or any previous visit (from the past 720 hour(s)).  Medical History: Past Medical History:  Diagnosis Date  . Gall stones   . H pylori ulcer     Medications:  Prescriptions Prior to Admission  Medication Sig Dispense Refill Last Dose  . Cholecalciferol 1000 units TBDP Take 1 tablet by mouth daily.   11/22/2016 at Unknown time  . clarithromycin (BIAXIN) 500 MG tablet Take 1 tablet by mouth every 12 (twelve) hours. For 10 days.  0 11/22/2016 at Unknown time  . DEXILANT 30 MG capsule Take 1 capsule by mouth daily.  2 11/21/2016 at Unknown time  . JUNEL FE 1/20 1-20 MG-MCG tablet Take 1 tablet by mouth daily.  8 11/21/2016 at Unknown time  . lisinopril (PRINIVIL,ZESTRIL) 10 MG tablet Take 1 tablet by mouth daily.  2 11/21/2016 at Unknown time  . metroNIDAZOLE (FLAGYL) 500 MG tablet Take 1 tablet by mouth 2 (two) times daily. 7 days.  0 11/22/2016 at Unknown time  . PRESCRIPTION MEDICATION Take 15 mLs by mouth every 2 (two) hours. GI Cocktail  0 11/21/2016 at Unknown time   Scheduled:  .  morphine injection  4 mg Intravenous Once  .  norethindrone-ethinyl estradiol  1 tablet Oral Daily   Infusions:  . ertapenem    . lactated ringers 100 mL/hr at 11/22/16 2333   PRN: acetaminophen **OR** acetaminophen, morphine injection, ondansetron **OR** ondansetron (ZOFRAN) IV Anti-infectives    Start     Dose/Rate Route Frequency Ordered Stop   11/23/16 0015  ertapenem (INVANZ) 1 g in sodium chloride 0.9 % 50 mL IVPB     1 g 100 mL/hr over 30 Minutes Intravenous  Once 11/23/16 0005        Assessment: 41 yo female with acute cholecystitis. Zosyn per pharmacy consult.  Pt with hives allergy to penicillins.  Called R. David to clarify if wanted to proceed with Zosyn.  Dr said to give her Pincus Sanesnvanz. Order placed.  <3% cross-reactivity reported in literature. RN notified to watch patient closely for any signs of reaction.   Plan:  Preliminary review of pertinent patient information completed.  Protocol will be initiated with dose(s) of invanz 1 gram.  Jeani HawkingAnnie Penn clinical pharmacist will complete review during morning rounds to assess patient and finalize treatment regimen if needed.  Loyola MastMidkiff, Virat Prather Scarlett, [facility] 11/23/2016,12:09 AM

## 2016-11-24 LAB — HIV ANTIBODY (ROUTINE TESTING W REFLEX): HIV SCREEN 4TH GENERATION: NONREACTIVE

## 2016-11-26 ENCOUNTER — Encounter (HOSPITAL_COMMUNITY): Payer: Self-pay | Admitting: General Surgery

## 2016-11-26 NOTE — Discharge Summary (Signed)
Physician Discharge Summary  Patient ID: Lynn Rivera MRN: [id number] DOB/AGE: 43/08/1975 43 y.o.  Admit date: 11/22/2016 Discharge date: 11/23/2016 Admission Diagnoses:Acute cholecystitis, cholelithiasis  Discharge Diagnoses: Same  Principal Problem:   Acute cholecystitis   Discharged Condition: good  Hospital Course: Patient is a 41 year old white female who presented to the emergency room with worsening right upper quadrant abdominal pain. She had a known history of cholelithiasis. She was admitted to the hospital for control of her pain and nausea. She supple he underwent laparoscopic cholecystectomy on 11/23/2016. She tolerated the procedure well. Her postoperative course was unremarkable. Her diet was advanced without difficulty. The patient was discharged home on 11/23/2016 in good and improving condition.  Treatments: surgery: Laparoscopic cholecystectomy on 11/23/2016  Discharge Exam: Blood pressure (!) 119/59, pulse 81, temperature 98.3 F (36.8 C), resp. rate (!) 21, height 5' 3.5" (1.613 m), weight 187 lb 8 oz (85 kg), last menstrual period 11/22/2016, SpO2 98 %. General appearance: alert, cooperative and no distress Resp: clear to auscultation bilaterally Cardio: regular rate and rhythm, S1, S2 normal, no murmur, click, rub or gallop GI: Soft, dressings dry and intact  Disposition: 01-Home or Self Care  Discharge Instructions    Diet general    Complete by:  As directed    Increase activity slowly    Complete by:  As directed      Allergies as of 11/23/2016      Reactions   Penicillins    Hives      Medication List    TAKE these medications   Cholecalciferol 1000 units Tbdp Take 1 tablet by mouth daily.   clarithromycin 500 MG tablet Commonly known as:  BIAXIN Take 1 tablet by mouth every 12 (twelve) hours. For 10 days.   DEXILANT 30 MG capsule Generic drug:  Dexlansoprazole Take 1 capsule by mouth daily.   HYDROcodone-acetaminophen 5-325 MG  tablet Commonly known as:  NORCO Take 1-2 tablets by mouth every 4 (four) hours as needed for moderate pain.   JUNEL FE 1/20 1-20 MG-MCG tablet Generic drug:  norethindrone-ethinyl estradiol Take 1 tablet by mouth daily.   lisinopril 10 MG tablet Commonly known as:  PRINIVIL,ZESTRIL Take 1 tablet by mouth daily.   metroNIDAZOLE 500 MG tablet Commonly known as:  FLAGYL Take 1 tablet by mouth 2 (two) times daily. 7 days.   PRESCRIPTION MEDICATION Take 15 mLs by mouth every 2 (two) hours. GI Cocktail      Follow-up Information    Franky MachoJenkins, Aceyn Kathol, MD On 12/04/2016.   Specialty:  General Surgery Why:  as previously scheduled Contact information: [address] [place] [id number] [phone]           Signed: Franky MachoMark Avyay Coger 11/26/2016, 9:23 AM

## 2016-12-04 ENCOUNTER — Ambulatory Visit (INDEPENDENT_AMBULATORY_CARE_PROVIDER_SITE_OTHER): Payer: Self-pay | Admitting: General Surgery

## 2016-12-04 ENCOUNTER — Ambulatory Visit: Payer: BLUE CROSS/BLUE SHIELD | Admitting: General Surgery

## 2016-12-04 ENCOUNTER — Encounter: Payer: Self-pay | Admitting: General Surgery

## 2016-12-04 VITALS — BP 115/85 | HR 93 | Temp 98.4°F | Resp 18 | Ht 64.0 in | Wt 190.0 lb

## 2016-12-04 DIAGNOSIS — Z09 Encounter for follow-up examination after completed treatment for conditions other than malignant neoplasm: Secondary | ICD-10-CM

## 2016-12-04 NOTE — Progress Notes (Signed)
Subjective:     Lynn Rivera  Status post lap cholecystectomy. Doing well. Objective:    BP 115/85   Pulse 93   Temp 98.4 F (36.9 C)   Resp 18   Ht 5\' 4"  (1.626 m)   Wt 190 lb (86.2 kg)   LMP 11/22/2016   BMI 32.61 kg/m   General:  alert, cooperative and no distress  Abdomen soft, incisions healing well. Staples removed, Steri-Strips applied. Final pathology consistent with diagnosis.     Assessment:    Doing well postoperatively.    Plan:   Increase activity as able. May return to work without restrictions on 12/10/2016. Follow-up here as needed.

## 2018-04-30 IMAGING — US US ABDOMEN LIMITED
1 series · 14 of 25 positions shown · non-contrast
Comparison: No prior .

CLINICAL DATA: Right upper quadrant pain.

EXAM:
ULTRASOUND ABDOMEN LIMITED RIGHT UPPER QUADRANT

[Series 1: us abdomen limited · 0.19mm/px · 14 of 52 slices shown]
[im 1/52]
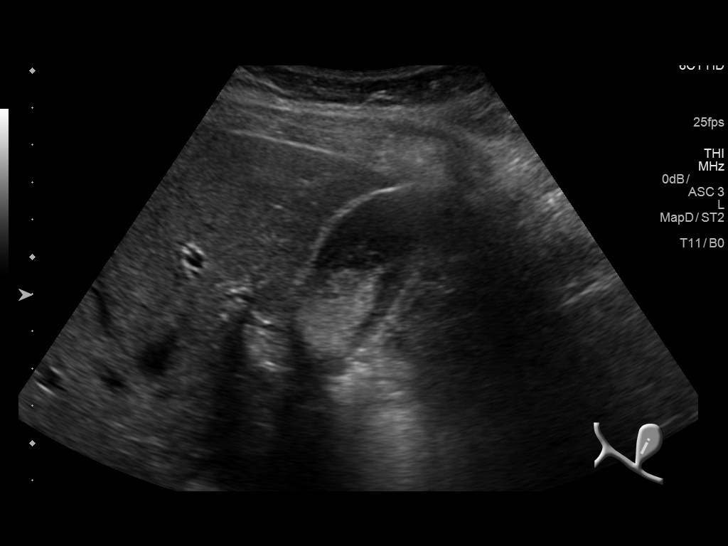
[im 5/52]
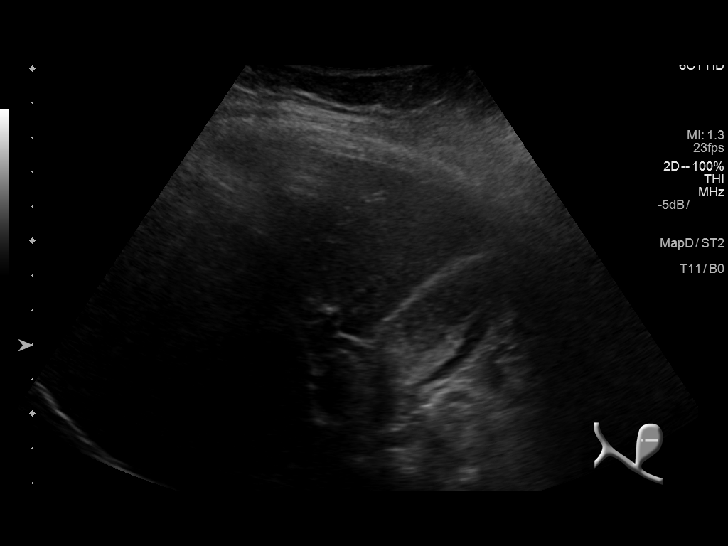
[im 9/52]
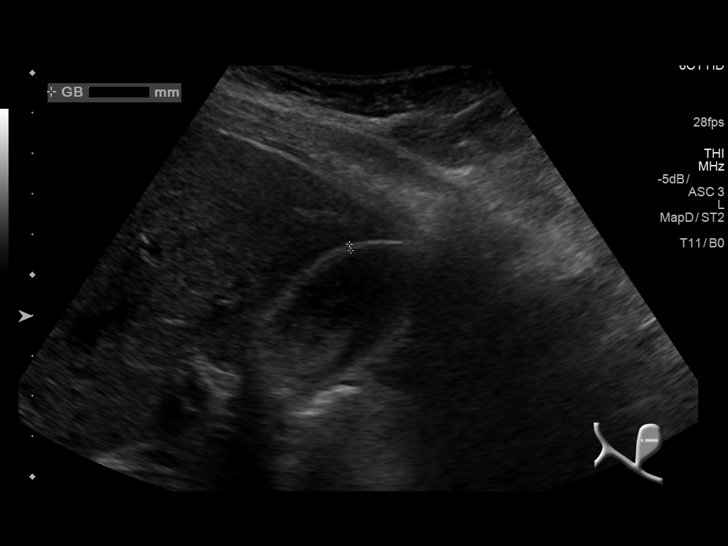
[im 13/52]
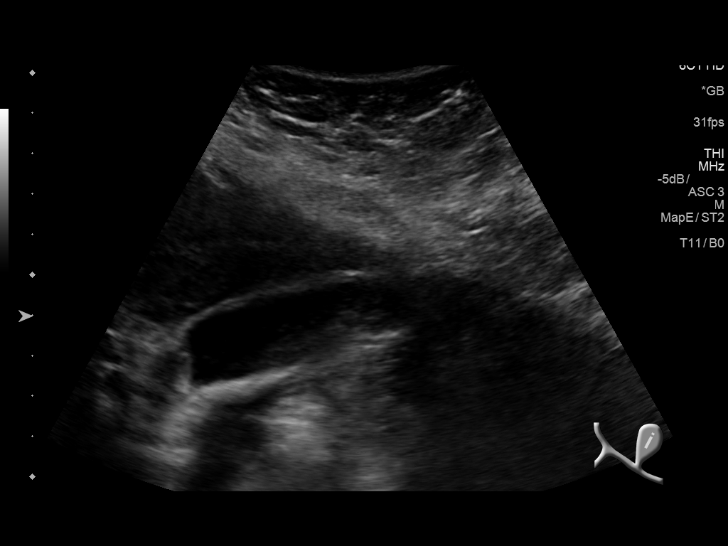
[im 18/52]
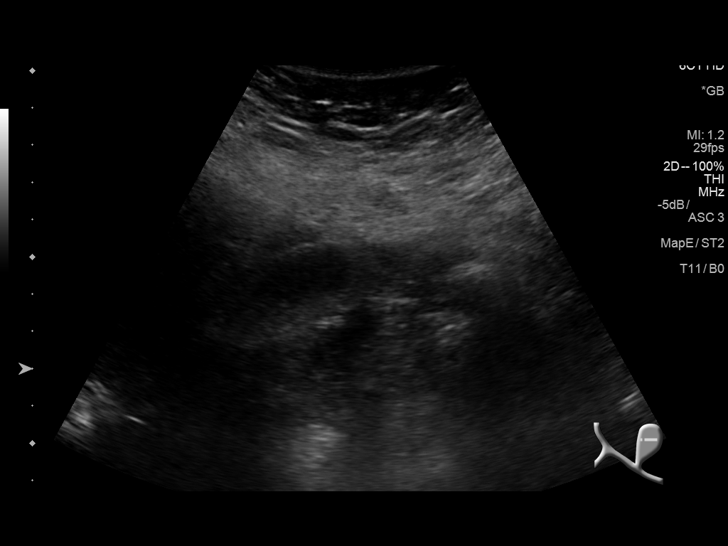
[im 20/52]
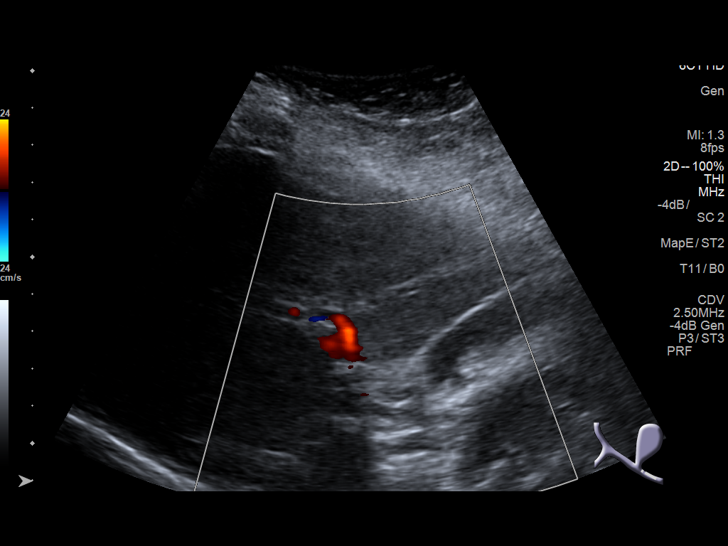
[im 24/52]
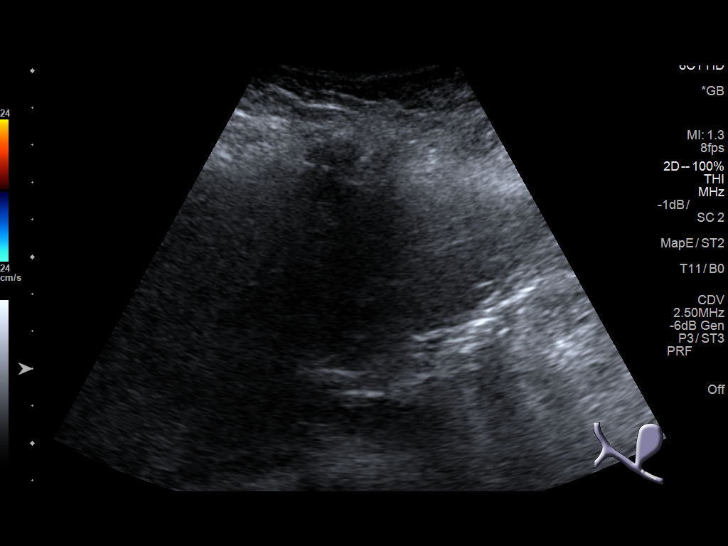
[im 28/52]
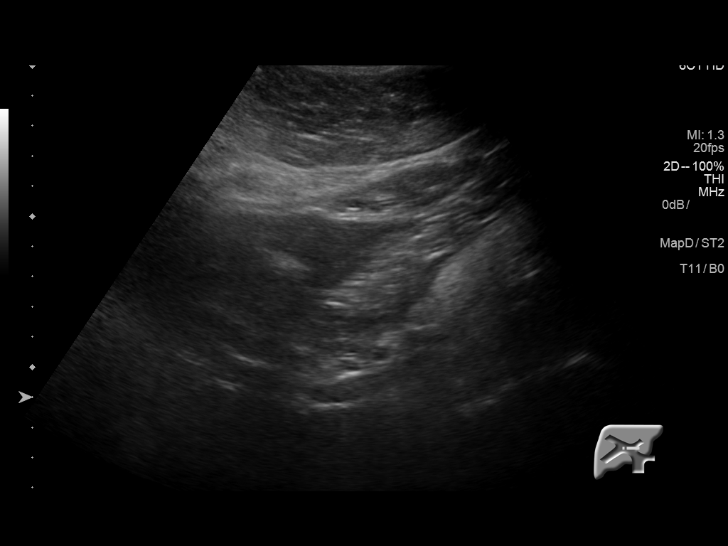
[im 32/52]
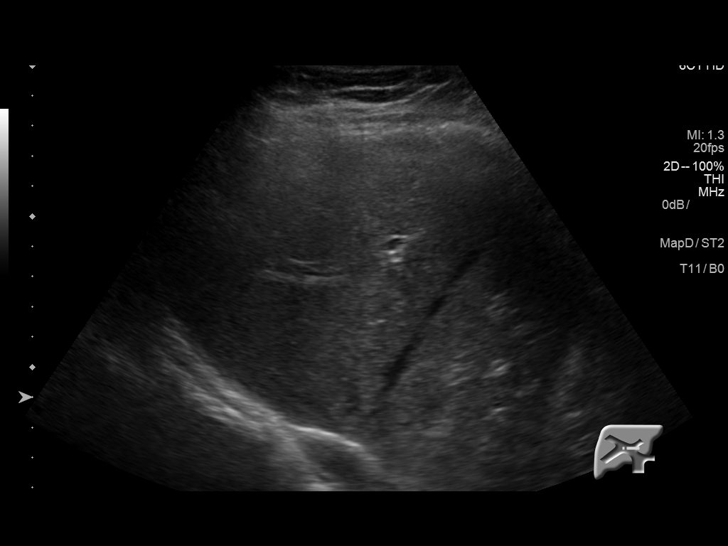
[im 35/52]
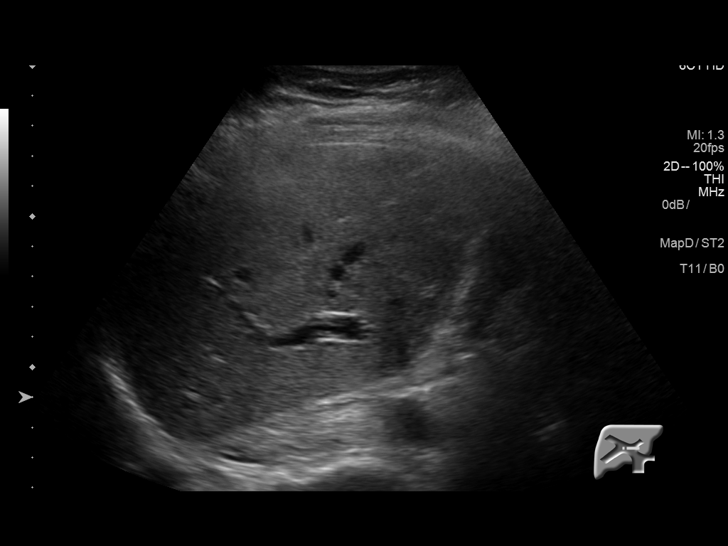
[im 39/52]
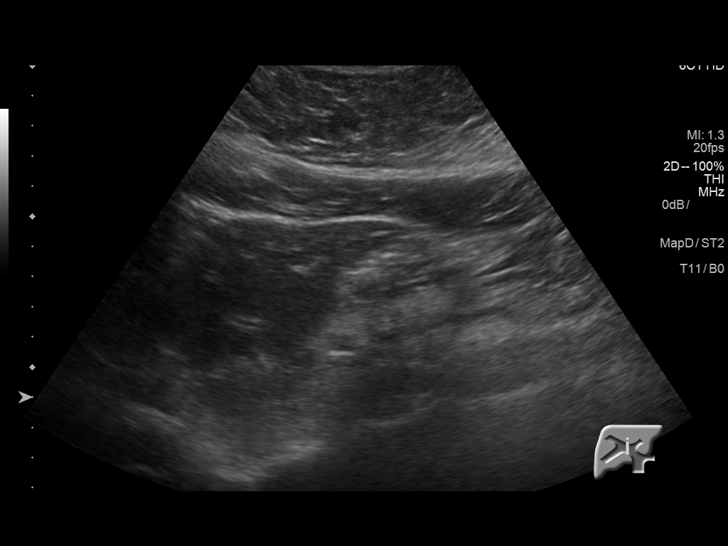
[im 43/52]
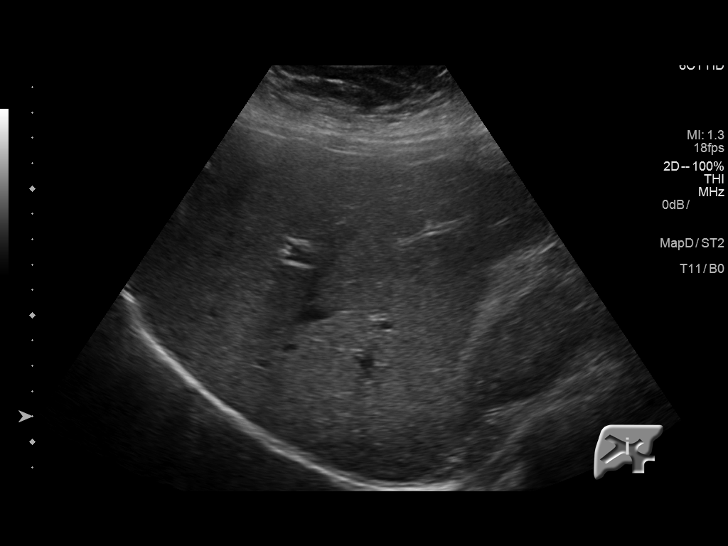
[im 47/52]
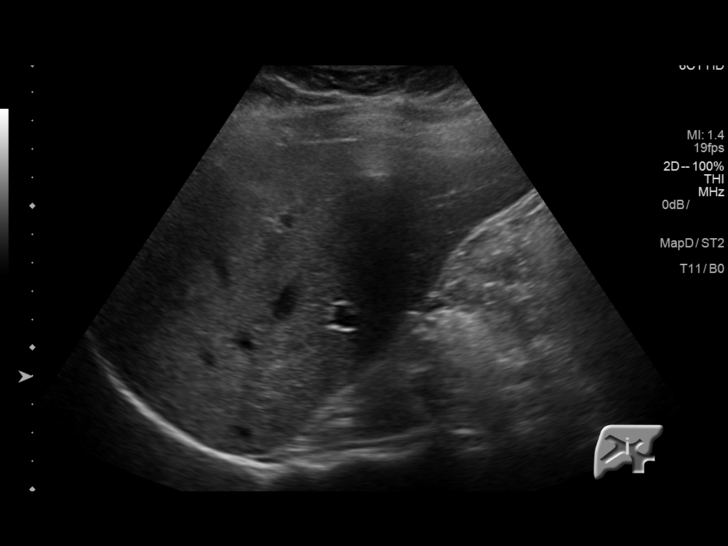
[im 52/52]
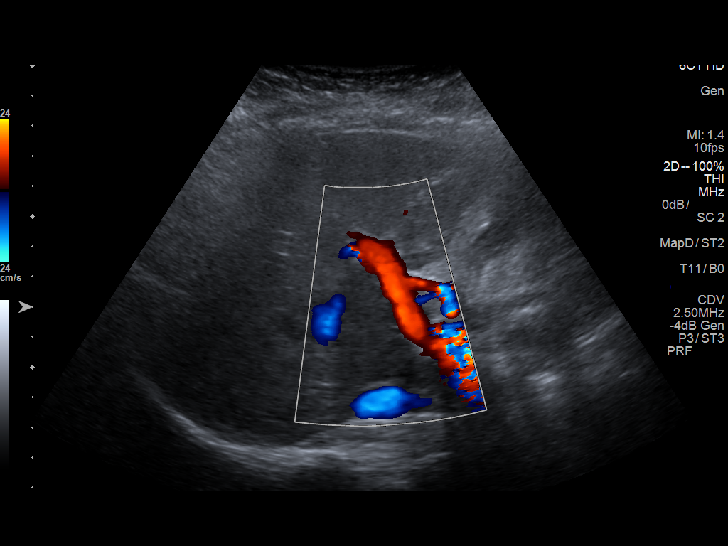

[14 of 25 positions shown; findings below may reference images not displayed]

FINDINGS: Gallbladder:

Prominent soft tissue density noted in the gallbladder most likely
tumefactive sludge. Gallbladder wall thickness 1.6 mm. Negative
Murphy sign.

Common bile duct:

Diameter: 5.0 mm.

Liver:

Slight increased hepatic echogenicity consistent fatty infiltration
and/or hepatocellular disease.
IMPRESSION: 1. Prominent soft tissue density noted in the gallbladder most
likely tumefactive sludge.

2. Slight increased hepatic echogenicity consistent with fatty
infiltration and/or hepatocellular disease.
